# Patient Record
Sex: Female | Born: 1970 | Hispanic: No | Marital: Married | Smoking: Never smoker
Health system: Southern US, Community
[De-identification: ages and names within clinical notes are randomized; demographics above are authoritative.]

---

## 2003-11-10 ENCOUNTER — Inpatient Hospital Stay (HOSPITAL_COMMUNITY): Admission: AD | Admit: 2003-11-10 | Discharge: 2003-11-11 | Payer: Self-pay | Admitting: Obstetrics and Gynecology

## 2003-11-11 ENCOUNTER — Inpatient Hospital Stay (HOSPITAL_COMMUNITY): Admission: AD | Admit: 2003-11-11 | Discharge: 2003-11-15 | Payer: Self-pay | Admitting: Obstetrics and Gynecology

## 2004-03-08 ENCOUNTER — Other Ambulatory Visit: Admission: RE | Admit: 2004-03-08 | Discharge: 2004-03-08 | Payer: Self-pay | Admitting: Obstetrics and Gynecology

## 2005-11-14 ENCOUNTER — Other Ambulatory Visit: Admission: RE | Admit: 2005-11-14 | Discharge: 2005-11-14 | Payer: Self-pay | Admitting: Obstetrics and Gynecology

## 2007-10-15 ENCOUNTER — Encounter: Admission: RE | Admit: 2007-10-15 | Discharge: 2007-10-15 | Payer: Self-pay | Admitting: Obstetrics and Gynecology

## 2007-10-18 ENCOUNTER — Inpatient Hospital Stay (HOSPITAL_COMMUNITY): Admission: RE | Admit: 2007-10-18 | Discharge: 2007-10-21 | Payer: Self-pay | Admitting: Obstetrics and Gynecology

## 2007-10-23 ENCOUNTER — Encounter: Admission: RE | Admit: 2007-10-23 | Discharge: 2007-11-12 | Payer: Self-pay | Admitting: Obstetrics and Gynecology

## 2010-08-23 NOTE — Op Note (Signed)
Katherine [name], Katherine [name]               ACCOUNT NO.:  [id number]   MEDICAL RECORD NO.:  [id number]          PATIENT TYPE:  INP   LOCATION:  9146                          FACILITY:  [facility]   PHYSICIAN:  Crist Fat. Rivard, M.D. DATE OF BIRTH:  12/12/1970   DATE OF PROCEDURE:  10/18/2007  DATE OF DISCHARGE:                               OPERATIVE REPORT   PREOPERATIVE DIAGNOSIS:  Intrauterine pregnancy at 37 weeks and 6 days  with previous cesarean sections with rupture of membranes and early  labor.   POSTOPERATIVE DIAGNOSIS:  Intrauterine pregnancy at 37 weeks and 6 days  with previous cesarean sections with rupture of membranes and early  labor.   ANESTHESIA:  Spinal, Burnett Corrente, MD.   PROCEDURE:  Repeat low-transverse cesarean section.   SURGEON:  Crist Fat. Rivard, MD   ASSISTANT:  Cam Hai, CNM   ESTIMATED BLOOD LOSS:  600 mL.   PROCEDURE:  After being informed of the planned procedure with possible  complications including bleeding, infection, and injury to bowels,  bladder, or ureters, informed consent was obtained.  The patient was  taken to OR #1, given spinal anesthesia without any complication.  She  was placed in the dorsal decubitus position, pelvis was tilted to the  left, prepped and draped in a sterile fashion and a Foley catheter was  inserted in her bladder.   After assessing adequate level of anesthesia, we infiltrated the  previous Pfannenstiel incision using 20 mL of Marcaine 0.25% and we  performed a Pfannenstiel incision, which was brought down sharply to the  fascia.  Fascia was incised in a low-transverse fashion.  Linea alba was  dissected.  Peritoneum was entered bluntly.  An Alexis retractor was  inserted easily.  Visceral peritoneum was entered in a low-transverse  fashion allowing [place] to safely retract bladder by developing a bladder  flap.  Myometrium was then entered in a low-transverse fashion, first  with knife, then extended bluntly.   Amniotic fluid was collected that  appeared normal.  We assisted the birth of a female infant at 6:37 a.m.  in right occiput anterior.  Mouth and nose were suctioned.  Nuchal cord  was reduced.  Baby was delivered.  Cord was clamped with 2 Kelly clamps  and sectioned, and the baby was given to the pediatrician, Dr. Christella Scheuermann  present in the room.  A 10 mL of blood was drawn from the umbilical vein  and we awaited the spontaneous expulsion of the placenta.  This placenta  was complete and the cord had 3 vessels.  It was sent to Labor and  Delivery.  Uterine revision was negative.   Myometrium was then closed in 2 layers, first with a running lock suture  of 0 Vicryl, then with a Lembert suture of 0 Vicryl imbricating the  first one.  Hemostasis is completed on peritoneal edges with  cauterization.  Both paracolic gutters were cleaned.  Both tubes and  ovaries were assessed and normal.  We irrigated the pelvis profusely  with warm saline and we noted a satisfactory hemostasis.  Retractors  were removed  and under fascia hemostasis was completed with cautery and  the fascia was closed with 2 running sutures of 1 Vicryl meeting  midline.  The wound was irrigated with warm saline and the skin was  closed with a subcuticular suture of 3-0 Monocryl and Steri-Strips.   Instruments and sponge count was complete x2.  Estimated blood loss was  600 mL.  The procedure was very well tolerated by the patient who was  taken to the recovery room in a well and stable condition.   Little girl named Serena Colonel, was born at 6:37 a.m., received an  Apgar of 8 at 1 minute and 9 at 5 minutes and weighed 7 pounds 4 ounces.   SPECIMEN:  Placenta sent to Labor and Delivery.      Crist Fat Rivard, M.D.  Electronically Signed     SAR/MEDQ  D:  10/18/2007  T:  10/19/2007  Job:  [id number]

## 2010-08-23 NOTE — H&P (Signed)
NAME:  Katherine [name], Katherine [name]               ACCOUNT NO.:  [id number]   MEDICAL RECORD NO.:  [id number]          PATIENT TYPE:  MAT   LOCATION:  DFTL                          [facility]:  [facility]   PHYSICIAN:  Crist Fat. Rivard, M.D. DATE OF BIRTH:  [birth year]   DATE OF ADMISSION:  10/18/2007  DATE OF DISCHARGE:                              HISTORY & PHYSICAL   Katherine [name] is a 40 year old married white female gravida 2, para 1-0-0-  1, at 37-6/7th weeks, who presents with leaking clear fluid since  approximately 2 a.m. with onset of cramping.  At that time, she denies  bleeding and reports positive fetal movement.  Her pregnancy has been  followed by the [facility] that has been  remarkable for:  1. Previous C-section, desires repeat, which has been scheduled for      October 28, 2007.  2. Depression and anxiety.  3. Unrepaired periumbilical hernia.  4. Group B strep negative.   LABORATORY DATA:  Her prenatal labs were collected on May 03, 2007.  Hemoglobin 10.1, hematocrit 29.2, and platelets 254,000.  Blood type O  positive, antibody negative, RPR nonreactive, rubella immune, hepatitis  B surface antigen negative, HIV nonreactive, gonorrhea negative,  Chlamydia negative, and Pap smear within normal limits from August 2008.  Cystic fibrosis negative.  First-trimester screen from April 23, 2007  was within normal limits.  A 1-hour Glucola from August 06, 2007 was 114.  Culture of the vaginal tract for group B strep in the third trimester  was negative.   HISTORY OF PRESENT PREGNANCY:  The patient presented for care at [facility] on March 19, 2007 at 37-5/7th weeks' gestation.  Ultrasound  on that date confirmed best [facility] to be November 02, 2007.  The patient was  taking Effexor, and a taper schedule to wean her off that medication was  started within a couple of weeks of her first visit.  She had normal  first-trimester screen at 12 weeks' gestation.  She had some  spotting at  14 weeks' gestation.  By 15 weeks, she had tapered off Effexor and was  only on Zoloft and was doing well.  She had anatomy scan at 18 weeks'  gestation that showed growth consistent with previous dating.  She has a  normal 1-hour Glucola.  She had some hernia tenderness at 27 weeks that  resolved.  Rest of her prenatal care has been unremarkable.   OB HISTORY:  She is a gravida 2, para 1-0-0-1.  In August 2005, she had  a C-section for a female infant weighing 8 pounds 3 ounces at 40 weeks'  gestation.  After 28 hours of labor, she had an epidural for anesthesia.  C-section was done for failure to descend, and the infant's name was  Huntley Dec.  She had a postpartum depression after that baby.  Second  pregnancy is the current pregnancy.   PAST MEDICAL HISTORY:  She has no medication allergies.  She experienced  menarche at the age of 54 with 24-day cycles lasting 5 days.  She has  questionable history of chicken  pox.  She has had one episode of  cystitis.  History of depression and anxiety since 2003, followed by Dr.  Uvaldo Rising.  She fractured her left arm at the age of 68, and fractured her  right foot at the age of 66.   PAST SURGICAL HISTORY:  Remarkable for a C-section in 2005, laser eye  surgery in 2001 and 2002, and wisdom teeth extraction.   FAMILY MEDICAL HISTORY:  Father and maternal grandmother with  hypertension.  Multiple family members with varicosities.  Paternal  grandfather with asthma.  Paternal grandmother with diabetes.  Mother  and paternal grandmother with thyroid dysfunction.  Maternal grandmother  with dialysis.  Paternal grandmother with CVA.  Maternal grandfather  with larynx cancer.   GENETIC HISTORY:  Remarkable for father of the baby's brother with  clotting disorder.   SOCIAL HISTORY:  The patient is married, lives with father of the baby.  His name is Homero Fellers.  He is involved and supportive.  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  They denied  any alcohol, tobacco, or illicit drug use with the pregnancy.   OBJECTIVE DATA:  VITAL SIGNS:  Stable.  She is afebrile.  HEENT:  Grossly within normal limits.  CHEST:  Clear to auscultation.  HEART:  Regular rate and rhythm.  ABDOMEN:  Gravid.  Contour with fundal height extending approximately 37  cm above pubic symphysis.  Fetal heart rate is reactive and reassuring.  There are irregular mild contractions.  RECTAL:  Sterile speculum exam shows positive pooling, positive  Nitrazine, and positive ferning of clear fluid.  Cervix is 1 cm, 60%  effaced.  Vertex -2, which is unchanged from the last exam.  EXTREMITIES:  Within normal limits.   ASSESSMENT:  1. Intrauterine pregnancy at term.  2. Spontaenous rupture of membranes.  3. Scheduled cesarean section.   PLAN:  Prepare the patient for the operating room, and Dr. Estanislado Pandy will  perform cesarean section.      Cam Hai, C.N.M.      Crist Fat Rivard, M.D.  Electronically Signed    KS/MEDQ  D:  10/18/2007  T:  10/19/2007  Job:  [id number]

## 2010-08-23 NOTE — Discharge Summary (Signed)
Katherine [name], [name]               ACCOUNT NO.:  [id number]   MEDICAL RECORD NO.:  [id number]          PATIENT TYPE:  INP   LOCATION:  9146                          [facility]   PHYSICIAN:  Crist Fat. Rivard, M.D. DATE OF BIRTH:  1970/08/02   DATE OF ADMISSION:  10/18/2007  DATE OF DISCHARGE:  10/21/2007                               DISCHARGE SUMMARY   ADMITTING DIAGNOSES:  1. Intrauterine pregnancy at 37-6/7 weeks.  2. Previous cesarean section with desire for repeat.  3. Rupture of membranes.   DISCHARGE DIAGNOSES:  1. Intrauterine pregnancy at 37-6/7 weeks.  2. Previous cesarean section with desire for repeat.  3. Rupture of membranes.   PROCEDURES:  Repeat low-transverse cesarean section.   HOSPITAL COURSE:  Katherine [name] is a 40 year old gravida 2, para 1-0-0-1  at 37-6/7 weeks who presents with leaking clear fluid since early in the  morning of October 18, 2007.  She did have some cramping.  Her pregnancy  had been remarkable for:  1. Previous cesarean section with desire for repeat.  2. Depression and anxiety with the patient on Zoloft on her pregnancy.  3. Unrepaired periumbilical hernia.  4. Group B strep, unknown.   Admission fetal heart rate is reactive.  There were regular contractions  noted, verification of ruptured membranes was accomplished and the  cervix was 160% vertex at -2.  In light of the patient's plan for a  repeat cesarean section on 07:20, she was prepped for OR.  The patient  was taken to the operating room where a repeat low-transverse cesarean  section was performed by Dr. Estanislado Pandy under spinal anesthesia.  Findings  were a viable female by the name of Katherine [name], born at 06:37 a.m.  Apgars  were 8 and 8.  Weight was 7 pounds 4 ounces.  The patient tolerated the  procedure well and was taken to recovery in good condition.  Infant was  taken to the full-term nursery.  By postop day #1, the patient is doing  well.  She was having some incisional pain.  She  was initially bottle  feeding then went to breast feeding, then decided to do a little bit of  both.  Her hemoglobin on day #1 was 10.4 down from 12.1, white blood  cell count was 9.5, and platelet count was 159.  Her incision was clean,  dry, and intact; closed with subcuticular sutures and Steri-Strips.   Her physical exam was within normal limits.  She was tolerating pain  medication and a regular diet without difficulty.  Rest of the patient's  hospital stay was uncomplicated.  She did have a social work consult  secondary to her history of postpartum depression and general  depression/anxiety.  The patient was remained on Zoloft 150 mg p.o.  daily.  She seemed to have good support and good awareness of the signs  and symptoms of postpartum depression and was having no issues at the  time of her discharge.  By the postop day #3, the patient is doing well.  Incision was clean, dry, and intact.  She was not decided regarding  birth control.  She was doing a little bit of both breast and bottle  feeding.  She was deemed to receive full benefit of her hospital stay  and was discharged home.   DISCHARGE INSTRUCTIONS:  Per [facility] handout.   DISCHARGE MEDICATIONS:  1. Motrin 600 mg p.o. q.6 h. p.r.n. pain.  2. Percocet 5/325 one to two p.o. q.24 h. p.r.n. pain.  3. Zoloft 150 mg p.o. daily.   FOLLOWUP:  Discharge followup will occur in 6 weeks at [facility] or p.r.n.  Signs and symptoms of postpartum depression were reviewed  again with the patient, and she will notify us with any issues.      Renaldo Reel Emilee Hero, C.N.M.      Crist Fat Rivard, M.D.  Electronically Signed    VLL/MEDQ  D:  10/21/2007  T:  10/21/2007  Job:  [id number]

## 2011-01-05 LAB — CBC
HCT: 31.1 — ABNORMAL LOW
MCHC: 33.8
MCV: 85.7
Platelets: 159
Platelets: 186
RBC: 4.18
RDW: 12.9
RDW: 13.1

## 2011-01-05 LAB — RPR: RPR Ser Ql: NONREACTIVE

## 2011-06-08 ENCOUNTER — Ambulatory Visit (INDEPENDENT_AMBULATORY_CARE_PROVIDER_SITE_OTHER): Payer: BC Managed Care – PPO | Admitting: Obstetrics and Gynecology

## 2011-06-08 DIAGNOSIS — Z01419 Encounter for gynecological examination (general) (routine) without abnormal findings: Secondary | ICD-10-CM

## 2016-03-20 ENCOUNTER — Ambulatory Visit (INDEPENDENT_AMBULATORY_CARE_PROVIDER_SITE_OTHER): Payer: Self-pay | Admitting: Orthopedic Surgery

## 2016-03-22 ENCOUNTER — Ambulatory Visit (INDEPENDENT_AMBULATORY_CARE_PROVIDER_SITE_OTHER): Payer: Self-pay

## 2016-03-22 ENCOUNTER — Ambulatory Visit (INDEPENDENT_AMBULATORY_CARE_PROVIDER_SITE_OTHER): Payer: BC Managed Care – PPO | Admitting: Orthopedic Surgery

## 2016-03-22 ENCOUNTER — Encounter (INDEPENDENT_AMBULATORY_CARE_PROVIDER_SITE_OTHER): Payer: Self-pay | Admitting: Orthopedic Surgery

## 2016-03-22 ENCOUNTER — Encounter (INDEPENDENT_AMBULATORY_CARE_PROVIDER_SITE_OTHER): Payer: Self-pay

## 2016-03-22 DIAGNOSIS — R202 Paresthesia of skin: Secondary | ICD-10-CM | POA: Diagnosis not present

## 2016-03-22 DIAGNOSIS — M542 Cervicalgia: Secondary | ICD-10-CM | POA: Diagnosis not present

## 2016-03-22 DIAGNOSIS — R200 Anesthesia of skin: Secondary | ICD-10-CM | POA: Insufficient documentation

## 2016-03-22 DIAGNOSIS — M25512 Pain in left shoulder: Secondary | ICD-10-CM | POA: Diagnosis not present

## 2016-03-22 MED ORDER — METHOCARBAMOL 500 MG PO TABS: 500.0000 mg | ORAL_TABLET | Freq: Three times a day (TID) | ORAL | 0 refills | Status: DC | PRN

## 2016-03-22 MED ORDER — PREDNISONE 5 MG (21) PO TBPK: 5.0000 mg | ORAL_TABLET | Freq: Every day | ORAL | 0 refills | Status: DC

## 2016-03-24 NOTE — Progress Notes (Signed)
Office Visit Note   Patient: Katherine FischerMaria Mercedes [name]           Date of Birth: 01/19/1971           MRN: [id number] Visit Date: 03/22/2016 Requested by: Gweneth DimitriWendy McNeill, MD [address] [place], [place] [address] PCP: Gweneth DimitriMCNEILL,WENDY, MD  Subjective: Chief Complaint  Patient presents with  . Neck - Pain    HPI Katherine [name] is a 45 year old patient with left shoulder and neck pain.  Started the day before Thanksgiving where she had the onset of left greater than right pain in the upper back and neck.  She reported a little bit of discomfort prior to this for 2 weeks.  Describes stiffness and significant pain radiating anteriorly and posteriorly into the deep aspect of the left shoulder.  Describes left upper extremity pain with radiation down into the index finger with tingling.  Took Naprosyn and Flexeril which accelerated her depression.  It did ease the pain but that she still has significant symptoms.  States that she reports bilateral arm pain but it is worse on the left.  She's had no medications and Saturday.  Her symptoms radiate down to the left hand.  Describes a cramping pain in the shoulder.  This pain does wake her up sleep at night.  She is teaching as as a profession.  She's not a smoker.  Describes numbness and tingling in the fingertips only.              Review of Systems All systems reviewed are negative as they relate to the chief complaint within the history of present illness.  Patient denies  fevers or chills.    Assessment & Plan: Visit Diagnoses:  1. Cervicalgia   2. Numbness and tingling in left upper extremity   3. Acute pain of left shoulder     Plan: Impression is left sided radiculopathy with normal shoulder exam and significant the trapezial tenderness and radiation of pain and numbness down to the hand.  Plan is MRI of her cervical spine.  Also we discussed Medrol Dosepak which I think could be helpful.  I would not do any more narcotic pain medicine because of her  other mood adjustment drugs follow-up after the study  Follow-Up Instructions: Return for after MRI.   Orders:  Orders Placed This Encounter  Procedures  . XR Cervical Spine 2 or 3 views  . XR Shoulder Left  . MR Cervical Spine w/o contrast   Meds ordered this encounter  Medications  . predniSONE (STERAPRED UNI-PAK 21 TAB) 5 MG (21) TBPK tablet    Sig: Take 1 tablet (5 mg total) by mouth daily.    Dispense:  21 tablet    Refill:  0  . methocarbamol (ROBAXIN) 500 MG tablet    Sig: Take 1 tablet (500 mg total) by mouth every 8 (eight) hours as needed for muscle spasms.    Dispense:  30 tablet    Refill:  0      Procedures: No procedures performed   Clinical Data: No additional findings.  Objective: Vital Signs: There were no vitals taken for this visit.  Physical Exam   Constitutional: Patient appears well-developed HEENT:  Head: Normocephalic Eyes:EOM are normal Neck: Normal range of motion Cardiovascular: Normal rate Pulmonary/chest: Effort normal Neurologic: Patient is alert Skin: Skin is warm Psychiatric: Patient has normal mood and affect    Ortho Exam patient has full cervical spine range of motion flexion extension rotation.  She has  5 out of 5 grip EPL FPL interosseous wrist flexion-extension biceps triceps Dr. Loletha Carrowstrength.  Does have paresthesias C6 distribution left compared to right reflexes symmetric bilateral biceps triceps 1+ out of 4.  Radial pulses intact bilaterally.  Shoulder range of motion is full rotator cuff strength symmetric no impingement signs on the left negative apprehension relocation testing on the left.  Not much in the way of course grinding or crepitus in the left shoulder on this visit.  Specialty Comments:  No specialty comments available.  Imaging: No results found.   PMFS History: Patient Active Problem List   Diagnosis Date Noted  . Cervicalgia 03/22/2016  . Numbness and tingling in left upper extremity 03/22/2016   No  past medical history on file.  No family history on file.  No past surgical history on file. Social History   Occupational History  . Not on file.   Social History Main Topics  . Smoking status: Never Smoker  . Smokeless tobacco: Never Used  . Alcohol use Not on file  . Drug use: Unknown  . Sexual activity: Not on file

## 2016-04-01 ENCOUNTER — Other Ambulatory Visit: Payer: Self-pay

## 2016-04-05 ENCOUNTER — Ambulatory Visit
Admission: RE | Admit: 2016-04-05 | Discharge: 2016-04-05 | Disposition: A | Discharge status: Home / Self Care | Payer: BC Managed Care – PPO | Source: Ambulatory Visit | Attending: Orthopedic Surgery | Admitting: Orthopedic Surgery

## 2016-04-05 DIAGNOSIS — M542 Cervicalgia: Secondary | ICD-10-CM

## 2016-04-15 ENCOUNTER — Other Ambulatory Visit: Payer: Self-pay

## 2016-04-20 ENCOUNTER — Ambulatory Visit (INDEPENDENT_AMBULATORY_CARE_PROVIDER_SITE_OTHER): Payer: BC Managed Care – PPO | Admitting: Orthopedic Surgery

## 2019-12-03 ENCOUNTER — Other Ambulatory Visit: Payer: Self-pay | Admitting: Obstetrics and Gynecology

## 2019-12-03 DIAGNOSIS — R1900 Intra-abdominal and pelvic swelling, mass and lump, unspecified site: Secondary | ICD-10-CM

## 2019-12-16 ENCOUNTER — Other Ambulatory Visit: Payer: Self-pay | Admitting: Obstetrics and Gynecology

## 2019-12-18 ENCOUNTER — Ambulatory Visit
Admission: RE | Admit: 2019-12-18 | Discharge: 2019-12-18 | Disposition: A | Discharge status: Home / Self Care | Payer: BC Managed Care – PPO | Source: Ambulatory Visit | Attending: Obstetrics and Gynecology | Admitting: Obstetrics and Gynecology

## 2019-12-18 ENCOUNTER — Other Ambulatory Visit: Payer: Self-pay

## 2019-12-18 DIAGNOSIS — R1900 Intra-abdominal and pelvic swelling, mass and lump, unspecified site: Secondary | ICD-10-CM

## 2019-12-18 MED ORDER — IOPAMIDOL (ISOVUE-300) INJECTION 61%
100.0000 mL | Freq: Once | INTRAVENOUS | Status: AC | PRN
  Administered 2019-12-18: 100 mL via INTRAVENOUS

## 2019-12-24 ENCOUNTER — Ambulatory Visit: Payer: BC Managed Care – PPO | Admitting: Family Medicine

## 2019-12-24 ENCOUNTER — Other Ambulatory Visit: Payer: Self-pay

## 2019-12-24 VITALS — BP 110/78 | HR 88 | Ht 60.0 in | Wt 126.8 lb

## 2019-12-24 DIAGNOSIS — M419 Scoliosis, unspecified: Secondary | ICD-10-CM | POA: Insufficient documentation

## 2019-12-24 DIAGNOSIS — M542 Cervicalgia: Secondary | ICD-10-CM

## 2019-12-24 DIAGNOSIS — G5701 Lesion of sciatic nerve, right lower limb: Secondary | ICD-10-CM

## 2019-12-24 DIAGNOSIS — K429 Umbilical hernia without obstruction or gangrene: Secondary | ICD-10-CM | POA: Insufficient documentation

## 2019-12-24 DIAGNOSIS — F419 Anxiety disorder, unspecified: Secondary | ICD-10-CM | POA: Insufficient documentation

## 2019-12-24 DIAGNOSIS — M4126 Other idiopathic scoliosis, lumbar region: Secondary | ICD-10-CM | POA: Diagnosis not present

## 2019-12-24 DIAGNOSIS — M5441 Lumbago with sciatica, right side: Secondary | ICD-10-CM | POA: Diagnosis not present

## 2019-12-24 DIAGNOSIS — G4700 Insomnia, unspecified: Secondary | ICD-10-CM | POA: Insufficient documentation

## 2019-12-24 DIAGNOSIS — F32A Depression, unspecified: Secondary | ICD-10-CM | POA: Insufficient documentation

## 2019-12-24 MED ORDER — METHOCARBAMOL 500 MG PO TABS: 500.0000 mg | ORAL_TABLET | Freq: Three times a day (TID) | ORAL | 1 refills | Status: AC | PRN

## 2019-12-24 NOTE — Progress Notes (Signed)
    Subjective:    CC: Low back pain  HPI: Pt is a 49 y/o female presenting w/ R-sided low back / buttocks pain x 2 weeks w/ no known MOI.  She states that she has scoliosis and should be using a small heel lift in her L shoe to correct for this.  Main issue today is right low back pain and buttocks pain.  She notes occasional intermittent pain radiating down the posterior aspect of her right leg.  However the majority of her pain is in the buttocks and right low back.  She also has a chronic issue with thoracic back pain and cervical pain with a history of left-sided cervical radiculopathy in 2017 that now is mostly better.  Radiating pain: yes into her R LE LE numbness/tingling: No LE weakness: No Aggravating factors: walking Treatments tried: heat, stretching, Aleve, Methocarbamol  Pertinent review of Systems: No fevers or chills  Relevant historical information: Mild lumbar scoliosis.  Insomnia anxiety depression.   Objective:    Vitals:   12/24/19 1241  BP: 110/78  Pulse: 88  SpO2: 99%   General: Well Developed, well nourished, and in no acute distress.   MSK: L-spine normal-appearing Nontender midline. Mildly tender right lumbar paraspinal musculature. Normal lumbar motion. Extremity strength reflexes and sensation are intact. Negative slump test.  Right hip normal-appearing normal motion.  Tender palpation posterior aspect of greater trochanter. Hip abduction strength is excellent as is external rotation strength.  Lab and Radiology Results X-ray images obtained during CT scan abdomen and pelvis December 18, 2019 personally and independently reviewed. Mild lumbar scoliosis.  Minimal L5-S1 DDD   Impression and Recommendations:    Assessment and Plan: 49 y.o. female with right low back pain and right lateral posterior hip pain.  This occurs in the setting of mild scoliosis.  Pain mostly due to lumbosacral muscle spasm and dysfunction.  May have component  of piriformis syndrome as well.  Plan for trial of physical therapy.  Intermittent use of methocarbamol heating pad and TENS unit as well.  We will ask physical therapy to also work on thoracic and cervical spine pain as this is a somewhat chronic issue..   Orders Placed This Encounter  Procedures  . Ambulatory referral to Physical Therapy    Referral Priority:   Routine    Referral Type:   Physical Medicine    Referral Reason:   Specialty Services Required    Requested Specialty:   Physical Therapy   Meds ordered this encounter  Medications  . methocarbamol (ROBAXIN) 500 MG tablet    Sig: Take 1 tablet (500 mg total) by mouth every 8 (eight) hours as needed for muscle spasms.    Dispense:  30 tablet    Refill:  1    Discussed warning signs or symptoms. Please see discharge instructions. Patient expresses understanding.   The above documentation has been reviewed and is accurate and complete Clementeen Graham, M.D.

## 2019-12-24 NOTE — Patient Instructions (Signed)
Thank you for coming in today. Plan for PT.  Please let me know if you have a problem.  Use heat and TENS unit.   Recheck in 6 weeks or sooner if needed.    Ok to continue [employer].   TENS UNIT: This is helpful for muscle pain and spasm.   Search and Purchase a TENS 7000 2nd edition at  www.tenspros.com or www.Amazon.com It should be less than $30.     TENS unit instructions: Do not shower or bathe with the unit on . Turn the unit off before removing electrodes or batteries . If the electrodes lose stickiness add a drop of water to the electrodes after they are disconnected from the unit and place on plastic sheet. If you continued to have difficulty, call the TENS unit company to purchase more electrodes. . Do not apply lotion on the skin area prior to use. Make sure the skin is clean and dry as this will help prolong the life of the electrodes. . After use, always check skin for unusual red areas, rash or other skin difficulties. If there are any skin problems, does not apply electrodes to the same area. . Never remove the electrodes from the unit by pulling the wires. . Do not use the TENS unit or electrodes other than as directed. . Do not change electrode placement without consultating your therapist or physician. Marland Kitchen Keep 2 fingers with between each electrode. . Wear time ratio is 2:1, on to off times.    For example on for 30 minutes off for 15 minutes and then on for 30 minutes off for 15 minutes

## 2020-01-13 ENCOUNTER — Ambulatory Visit (INDEPENDENT_AMBULATORY_CARE_PROVIDER_SITE_OTHER): Payer: BC Managed Care – PPO | Admitting: Physical Therapy

## 2020-01-13 ENCOUNTER — Encounter: Payer: Self-pay | Admitting: Physical Therapy

## 2020-01-13 ENCOUNTER — Other Ambulatory Visit: Payer: Self-pay

## 2020-01-13 DIAGNOSIS — M545 Low back pain, unspecified: Secondary | ICD-10-CM | POA: Diagnosis not present

## 2020-01-13 DIAGNOSIS — M542 Cervicalgia: Secondary | ICD-10-CM

## 2020-01-13 DIAGNOSIS — M25551 Pain in right hip: Secondary | ICD-10-CM

## 2020-01-20 ENCOUNTER — Encounter: Payer: BC Managed Care – PPO | Admitting: Physical Therapy

## 2020-01-22 ENCOUNTER — Ambulatory Visit: Payer: BC Managed Care – PPO | Admitting: Physical Therapy

## 2020-01-22 ENCOUNTER — Other Ambulatory Visit: Payer: Self-pay

## 2020-01-22 DIAGNOSIS — M545 Low back pain, unspecified: Secondary | ICD-10-CM | POA: Diagnosis not present

## 2020-01-22 DIAGNOSIS — M25551 Pain in right hip: Secondary | ICD-10-CM | POA: Diagnosis not present

## 2020-01-22 DIAGNOSIS — M542 Cervicalgia: Secondary | ICD-10-CM | POA: Diagnosis not present

## 2020-01-24 ENCOUNTER — Encounter: Payer: Self-pay | Admitting: Physical Therapy

## 2020-01-24 NOTE — Patient Instructions (Signed)
Access Code: [email or web address] URL: [email or web address] Date: 01/24/2020 Prepared by: Sedalia Muta  Exercises Seated Cervical Sidebending Stretch - 2 x daily - 3 reps - 30 hold Seated Levator Scapulae Stretch - 2 x daily - 3 reps - 30 hold Standing Row with Anchored Resistance - 1 x daily - 2 sets - 10 reps Standing Shoulder Horizontal Abduction with Resistance - 1 x daily - 2 sets - 10 reps Tricep Push Up on Wall - 1 x daily - 2 sets - 10 reps Supine Figure 4 Piriformis Stretch - 2 x daily - 3 reps - 30 hold Seated Piriformis Stretch with Trunk Bend - 2 x daily - 3 reps - 30 hold Child's Pose with Sidebending - 2 x daily - 3 reps - 30 hold Supine Single Knee to Chest Stretch - 2 x daily - 3 reps - 30 hold Sidelying Hip Abduction - 1 x daily - 2 sets - 10 reps Clamshell - 1 x daily - 2 sets - 10 reps Supine Bridge - 1 x daily - 2 sets - 10 reps

## 2020-01-24 NOTE — Therapy (Signed)
[facility] [address] [place], [place], [address] Phone: [phone]   Fax:  [phone]  Physical Therapy Evaluation  Patient Details  Name: Katherine [name] MRN: [id number] Date of Birth: May 29, 1970 Referring Provider (PT): Clementeen Graham   Encounter Date: 01/13/2020   PT End of Session - 01/24/20 1529    Visit Number 1    Number of Visits 12    Date for PT Re-Evaluation 02/24/20    Authorization Type BCBS    PT Start Time 0805    PT Stop Time 0839    PT Time Calculation (min) 34 min    Activity Tolerance Patient tolerated treatment well    Behavior During Therapy [facility] for tasks assessed/performed           History reviewed. No pertinent past medical history.  History reviewed. No pertinent surgical history.  There were no vitals filed for this visit.    Subjective Assessment - 01/24/20 1527    Subjective Pt notes increased pain in R glute, no incident to report. She does like to run 3-4 mi. Increased pain with increased standing/walking. Also reports chroninc pain in neck/ previous disc issue. States tightness and tension in bil UTs and shoulder blades.    Patient Stated Goals decreased pain    Currently in Pain? Yes    Pain Score 4     Pain Location Hip    Pain Orientation Right    Pain Descriptors / Indicators Aching    Pain Type Acute pain    Pain Onset 1 to 4 weeks ago    Pain Frequency Intermittent    Aggravating Factors  increased standing/walking    Pain Score 3    Pain Location Neck    Pain Orientation Right;Left    Pain Descriptors / Indicators Tightness    Pain Type Chronic pain    Pain Onset More than a month ago    Pain Frequency Intermittent    Aggravating Factors  increased activity, UE movement              [facility] PT Assessment - 01/24/20 0001      Assessment   Medical Diagnosis R low back pain, hip pain, and chronic neck pain    Referring Provider (PT) Clementeen Graham    Prior Therapy no        Prior Function   Level of Independence Independent      Cognition   Overall Cognitive Status Within Functional Limits for tasks assessed      ROM / Strength   AROM / PROM / Strength AROM;Strength      AROM   Overall AROM Comments lumbar: mild limitation for flex, ext, and L SB;  Cervical: mild limitations for ext and SB.       Strength   Overall Strength Comments shoulder: 4/5 gross, scapular: 4-/5,  Hips: 4-/5 to 4/5       Palpation   Palpation comment tenderness in R glute, piriformis, glute med, Tightness and tenderness in bil UT, rhomboids.                       Objective measurements completed on examination: See above findings.       [facility] Adult PT Treatment/Exercise - 01/24/20 0001      Lumbar Exercises: Stretches   Piriformis Stretch 3 reps;30 seconds    Piriformis Stretch Limitations seated and supine      Manual Therapy   Manual Therapy Joint  mobilization;Soft tissue mobilization    Manual therapy comments skilled palpation and  monitoring of soft tissue with dry needling    Soft tissue mobilization STM/DTM to R glute .             Trigger Point Dry Needling - 01/24/20 0001    Consent Given? Yes    Education Handout Provided Yes    Muscles Treated Back/Hip Gluteus minimus;Gluteus maximus;Piriformis    Gluteus Minimus Response Palpable increased muscle length   R   Gluteus Maximus Response Palpable increased muscle length   R   Piriformis Response Palpable increased muscle length   R               PT Education - 01/24/20 1528    Education Details PT POC, exam findings, educationon dry needling, HEP    Person(s) Educated Patient    Methods Explanation;Demonstration;Verbal cues    Comprehension Verbalized understanding;Returned demonstration;Verbal cues required            PT Short Term Goals - 01/24/20 1531      PT SHORT TERM GOAL #1   Title Pt to be independent with initial HEP    Time 2    Period Weeks    Status New     Target Date 01/27/20             PT Long Term Goals - 01/24/20 1532      PT LONG TERM GOAL #1   Title Pt to be independent with final HEP    Time 6    Period Weeks    Status New    Target Date 02/24/20      PT LONG TERM GOAL #2   Title Pt to report decreased pain in R low back and glute to be 0-2/10 with activity    Time 6    Period Weeks    Status New    Target Date 02/24/20      PT LONG TERM GOAL #3   Title Pt to demo soft tissue limitations in UTs and glute to be WNL and pain free.    Time 6    Status New    Target Date 02/24/20      PT LONG TERM GOAL #4   Title Pt to be independent with HEP for neck and shoulder tightness and pain    Time 6    Period Weeks    Status New    Target Date 02/24/20      PT LONG TERM GOAL #5   Title Pt to demo improved strength of bil hips to at least 4+/5 to improve stability and pain    Time 6    Period Weeks    Status New    Target Date 02/24/20                  Plan - 01/24/20 1541    Clinical Impression Statement Pt presents with primary complaints of increased pain in low back, into R glute, as well as chronic tightness, spasm and pain in bil UT and thoracic region. Pt with muscle spasm in glute and bil UT and rhomboids today. Pt with mild postural dysfunction and mild scoliosis. Pt with decreased strength in hips and core, and has lack of effective HEP for Dx. Pt to benefit from skilled PT to improve.    Examination-Activity Limitations Locomotion Level;Reach Overhead;Sleep;Squat;Stairs;Stand;Lift    Examination-Participation Restrictions Cleaning;Community Activity;Shop;Yard Work    Conservation officer, historic buildings Stable/Uncomplicated    Clinical  Decision Making Low    Rehab Potential Good    PT Frequency 2x / week    PT Duration 6 weeks    PT Treatment/Interventions ADLs/Self Care Home Management;Cryotherapy;Electrical Stimulation;DME Instruction;Ultrasound;Traction;Moist Heat;Iontophoresis 4mg /ml  Dexamethasone;Gait training;Stair training;Functional mobility training;Therapeutic exercise;Balance training;Therapeutic activities;Orthotic Fit/Training;Patient/family education;Neuromuscular re-education;Manual techniques;Passive range of motion;Dry needling;Spinal Manipulations;Joint Manipulations;Taping    Consulted and Agree with Plan of Care Patient           Patient will benefit from skilled therapeutic intervention in order to improve the following deficits and impairments:  Increased muscle spasms, Decreased activity tolerance, Pain, Impaired flexibility, Improper body mechanics, Decreased strength, Decreased mobility  Visit Diagnosis: Acute right-sided low back pain without sciatica  Pain in right hip  Cervicalgia     Problem List Patient Active Problem List   Diagnosis Date Noted  . Anxiety 12/24/2019  . Chronic depression 12/24/2019  . Insomnia 12/24/2019  . Umbilical hernia 12/24/2019  . Lumbar scoliosis 12/24/2019  . Cervicalgia 03/22/2016    03/24/2016, PT, DPT 3:51 PM  01/24/20    [facility] [address] [place], [place], [place] Phone: [phone]   Fax:  [phone]  Name: Katherine [name] MRN: Billy [id number] Date of Birth: 1971/02/19

## 2020-01-24 NOTE — Therapy (Addendum)
[facility] [address] [place], [place], [address] Phone: [phone]   Fax:  [phone]  Physical Therapy Treatment  Patient Details  Name: Katherine [name] MRN: [id number] Date of Birth: 07/19/1970 Referring Provider (PT): Lynne Leader   Encounter Date: 01/22/2020   PT End of Session - 01/24/20 1557    Visit Number 2    Number of Visits 12    Date for PT Re-Evaluation 02/24/20    Authorization Type BCBS    PT Start Time [phone]    PT Stop Time 0930    PT Time Calculation (min) 43 min    Activity Tolerance Patient tolerated treatment well    Behavior During Therapy [facility] for tasks assessed/performed           No past medical history on file.  No past surgical history on file.  There were no vitals filed for this visit.   Subjective Assessment - 01/24/20 1556    Subjective Pt states significant improvement since last visit,minimal pain since last visit. no pain today in glute. UTs feel tight    Pain Score 3    Pain Location Neck    Pain Orientation Left;Right                             [facility] Adult PT Treatment/Exercise - 01/24/20 1553      Exercises   Exercises Lumbar;Neck      Neck Exercises: Theraband   Rows 20 reps;Green    Horizontal ABduction Green;20 reps    Horizontal ABduction Limitations GTB      Neck Exercises: Standing   Other Standing Exercises Wall push ups x 20      Lumbar Exercises: Stretches   Single Knee to Chest Stretch 3 reps;30 seconds    Piriformis Stretch 3 reps;30 seconds    Piriformis Stretch Limitations seated and supine    Other Lumbar Stretch Exercise Childs pose 30 sec x 2 L, R, center       Lumbar Exercises: Aerobic   Recumbent Bike L2 x 6 min      Lumbar Exercises: Supine   Bridge 20 reps      Lumbar Exercises: Sidelying   Clam 10 reps;Both    Hip Abduction 20 reps;Both      Neck Exercises: Stretches   Upper Trapezius Stretch 2 reps;30 seconds     Levator Stretch 2 reps;30 seconds                  PT Education - 01/24/20 1557    Education Details Educated on initial HEP    Person(s) Educated Patient    Methods Explanation;Demonstration;Tactile cues;Handout;Verbal cues    Comprehension Verbalized understanding;Returned demonstration;Verbal cues required;Need further instruction;Tactile cues required            PT Short Term Goals - 01/24/20 1531      PT SHORT TERM GOAL #1   Title Pt to be independent with initial HEP    Time 2    Period Weeks    Status New    Target Date 01/27/20             PT Long Term Goals - 01/24/20 1532      PT LONG TERM GOAL #1   Title Pt to be independent with final HEP    Time 6    Period Weeks    Status New    Target Date 02/24/20  PT LONG TERM GOAL #2   Title Pt to report decreased pain in R low back and glute to be 0-2/10 with activity    Time 6    Period Weeks    Status New    Target Date 02/24/20      PT LONG TERM GOAL #3   Title Pt to demo soft tissue limitations in UTs and glute to be WNL and pain free.    Time 6    Status New    Target Date 02/24/20      PT LONG TERM GOAL #4   Title Pt to be independent with HEP for neck and shoulder tightness and pain    Time 6    Period Weeks    Status New    Target Date 02/24/20      PT LONG TERM GOAL #5   Title Pt to demo improved strength of bil hips to at least 4+/5 to improve stability and pain    Time 6    Period Weeks    Status New    Target Date 02/24/20                 Plan - 01/24/20 1558    Clinical Impression Statement Pt with decreased pain in glute today. Does have tightness and tenderness in UTs. Reviewed initial HEP and best ther ex today. Plan to do manual for cervical pain/tightness next visit.    Examination-Activity Limitations Locomotion Level;Reach Overhead;Sleep;Squat;Stairs;Stand;Lift    Examination-Participation Restrictions Cleaning;Community Activity;Shop;Yard Work     Stability/Clinical Decision Making Stable/Uncomplicated    Rehab Potential Good    PT Frequency 2x / week    PT Duration 6 weeks    PT Treatment/Interventions ADLs/Self Care Home Management;Cryotherapy;Electrical Stimulation;DME Instruction;Ultrasound;Traction;Moist Heat;Iontophoresis 4mg /ml Dexamethasone;Gait training;Stair training;Functional mobility training;Therapeutic exercise;Balance training;Therapeutic activities;Orthotic Fit/Training;Patient/family education;Neuromuscular re-education;Manual techniques;Passive range of motion;Dry needling;Spinal Manipulations;Joint Manipulations;Taping    Consulted and Agree with Plan of Care Patient           Patient will benefit from skilled therapeutic intervention in order to improve the following deficits and impairments:  Increased muscle spasms, Decreased activity tolerance, Pain, Impaired flexibility, Improper body mechanics, Decreased strength, Decreased mobility  Visit Diagnosis: Acute right-sided low back pain without sciatica  Pain in right hip  Cervicalgia     Problem List Patient Active Problem List   Diagnosis Date Noted  . Anxiety 12/24/2019  . Chronic depression 12/24/2019  . Insomnia 12/24/2019  . Umbilical hernia 35/36/1443  . Lumbar scoliosis 12/24/2019  . Cervicalgia 03/22/2016    Lyndee Hensen, PT, DPT 3:59 PM  01/24/20    [facility] [place], [place], [address] Phone: [phone]   Fax:  [phone]  Name: Katherine [name] MRN: [id number] Date of Birth: October 24, 1970   PHYSICAL THERAPY DISCHARGE SUMMARY  Visits from Start of Care: 2 Plan: Patient agrees to discharge.  Patient goals were partially met. Patient is being discharged due to not returning since the last visit.  ?????     Lyndee Hensen, PT, DPT 1:08 PM  08/03/20

## 2020-01-27 ENCOUNTER — Encounter: Payer: BC Managed Care – PPO | Admitting: Physical Therapy

## 2020-01-29 ENCOUNTER — Encounter: Payer: BC Managed Care – PPO | Admitting: Physical Therapy

## 2020-02-03 ENCOUNTER — Encounter: Payer: BC Managed Care – PPO | Admitting: Physical Therapy

## 2020-02-04 ENCOUNTER — Ambulatory Visit: Payer: BC Managed Care – PPO | Admitting: Family Medicine

## 2020-02-05 ENCOUNTER — Encounter: Payer: BC Managed Care – PPO | Admitting: Physical Therapy

## 2020-02-10 ENCOUNTER — Encounter: Payer: BC Managed Care – PPO | Admitting: Physical Therapy

## 2020-02-12 ENCOUNTER — Encounter: Payer: BC Managed Care – PPO | Admitting: Physical Therapy

## 2021-04-17 IMAGING — CT CT ABD-PELV W/ CM
1 of 3 series · 13 of 32 positions shown, 19 images · IV contrast (APPLIED)
Comparison: None

CLINICAL DATA: Abdominal mass and tenderness on exam.

EXAM:
CT ABDOMEN AND PELVIS WITH CONTRAST
TECHNIQUE: Multidetector CT imaging of the abdomen and pelvis was performed
using the standard protocol following bolus administration of
intravenous contrast.
CONTRAST:  100mL 76MNSY-Q22 IOPAMIDOL (76MNSY-Q22) INJECTION 61%

[Series 2: abd/pelvis w/cm · axial · 0.69mm/px · z∈[-743,-333]mm · 13 of 96 slices shown, 19 images]
[im 7/96  soft-tissue]
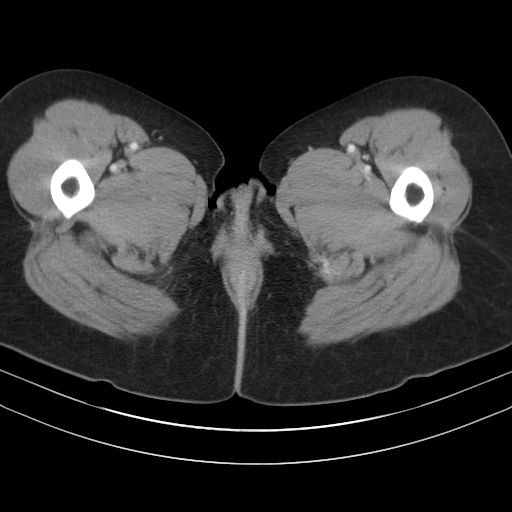
[im 7/96  bone]
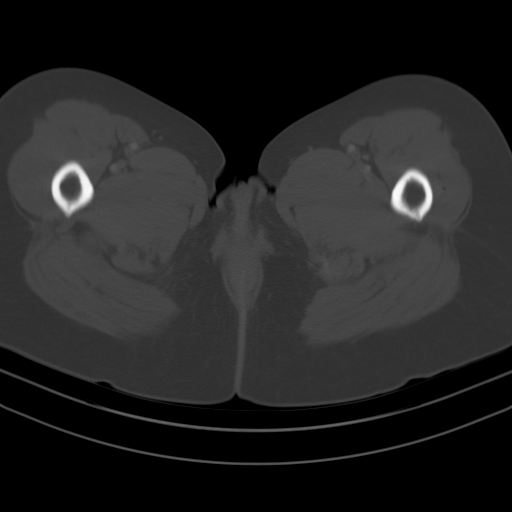
[im 13/96  soft-tissue]
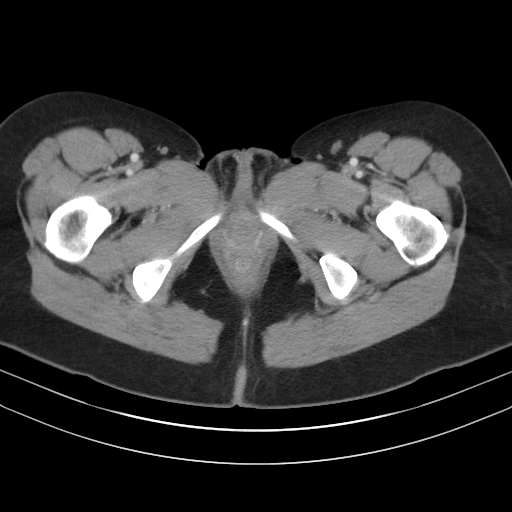
[im 20/96  soft-tissue]
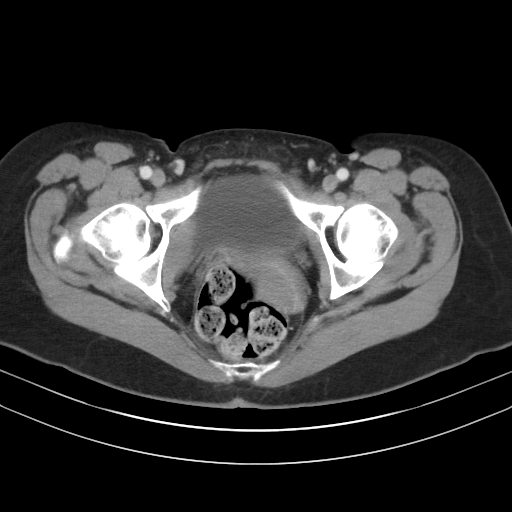
[im 26/96  soft-tissue]
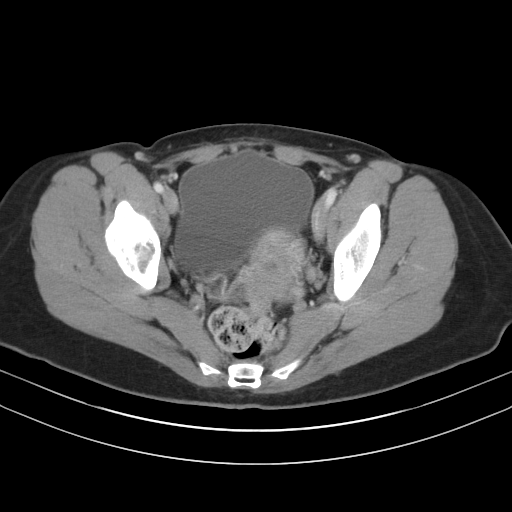
[im 32/96  soft-tissue]
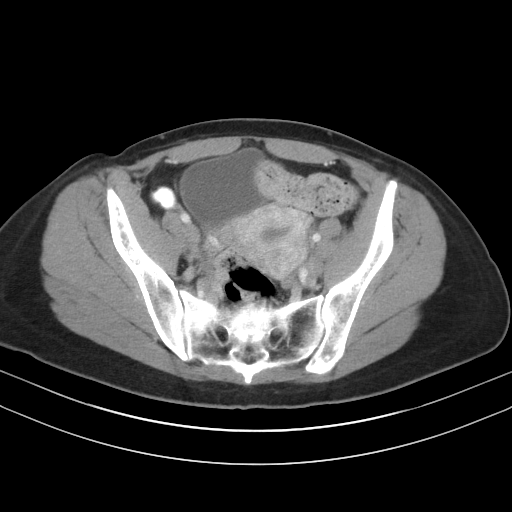
[im 39/96  soft-tissue]
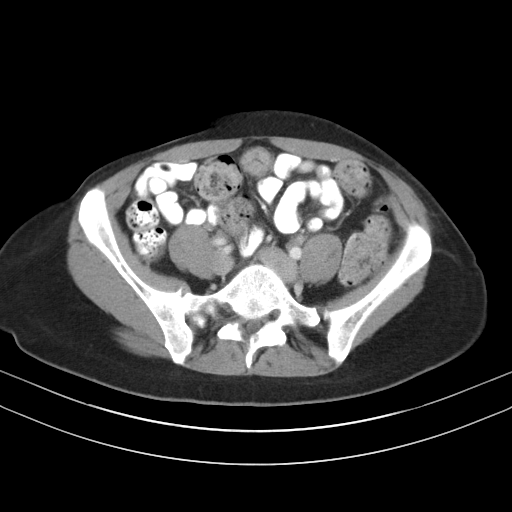
[im 51/96  soft-tissue]
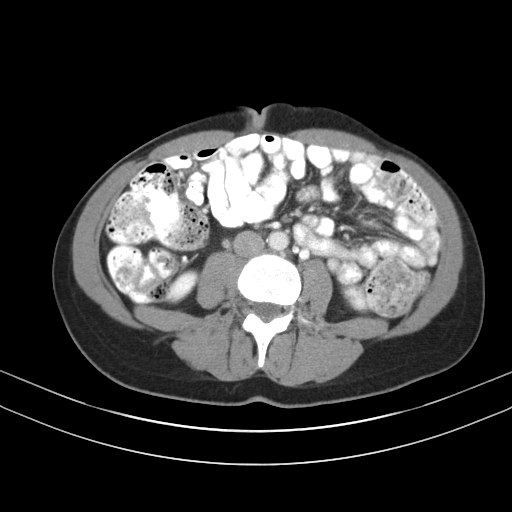
[im 58/96  soft-tissue]
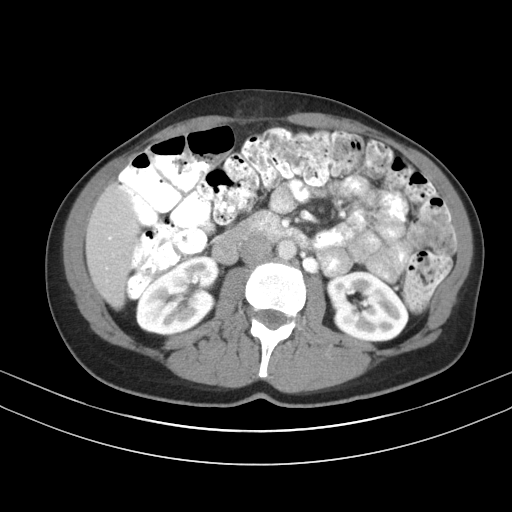
[im 64/96  soft-tissue]
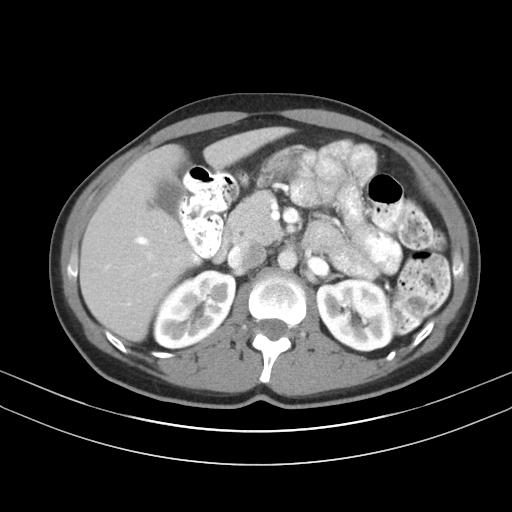
[im 64/96  bone]
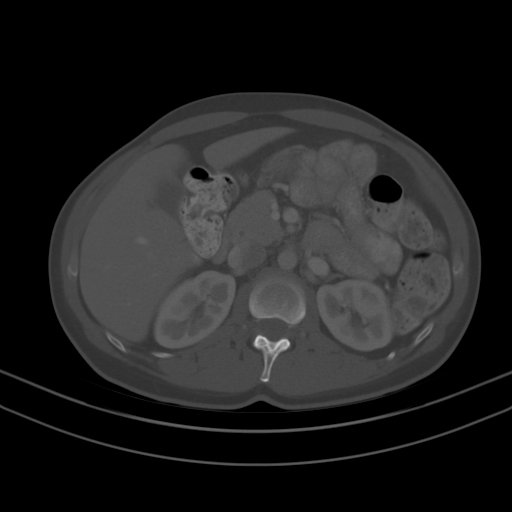
[im 70/96  soft-tissue]
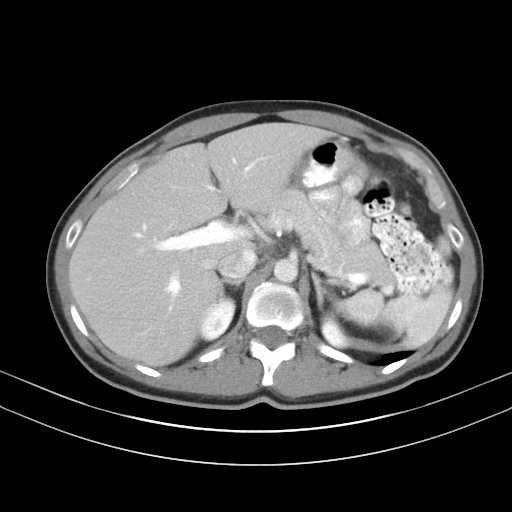
[im 70/96  lung]
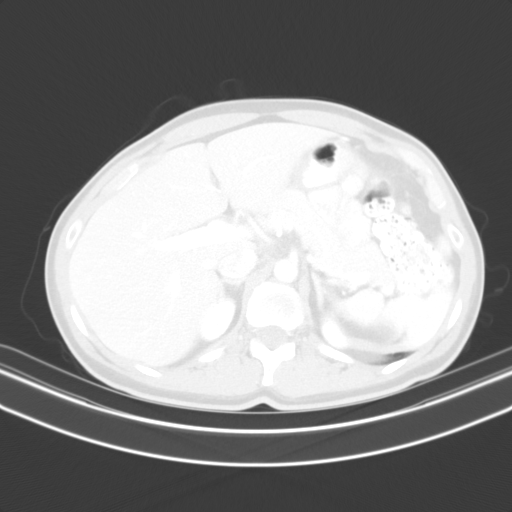
[im 77/96  soft-tissue]
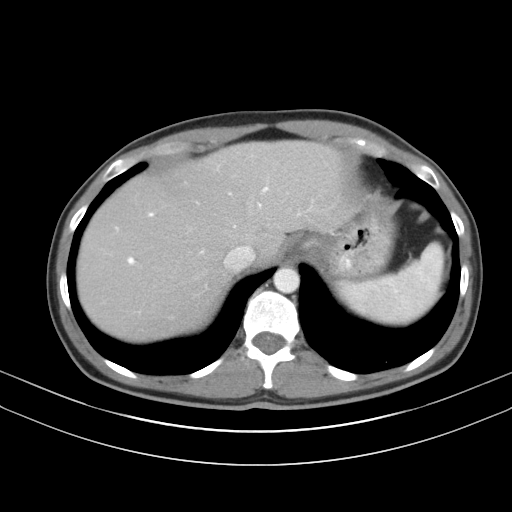
[im 77/96  lung]
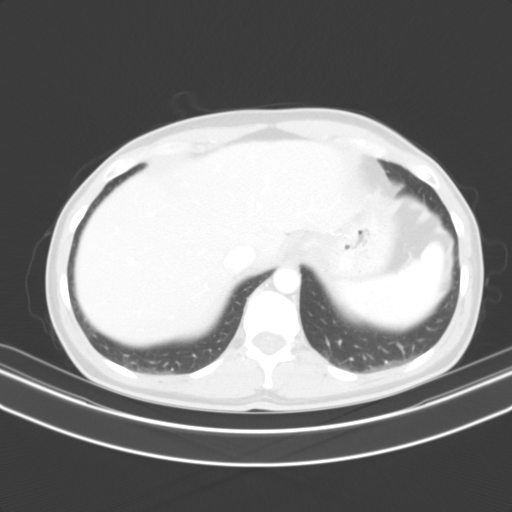
[im 83/96  soft-tissue]
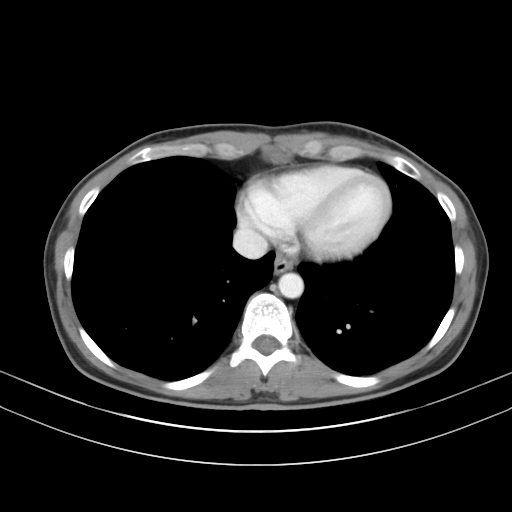
[im 83/96  lung]
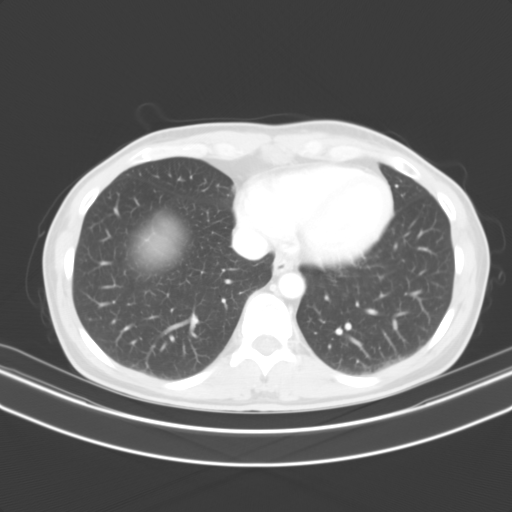
[im 89/96  soft-tissue]
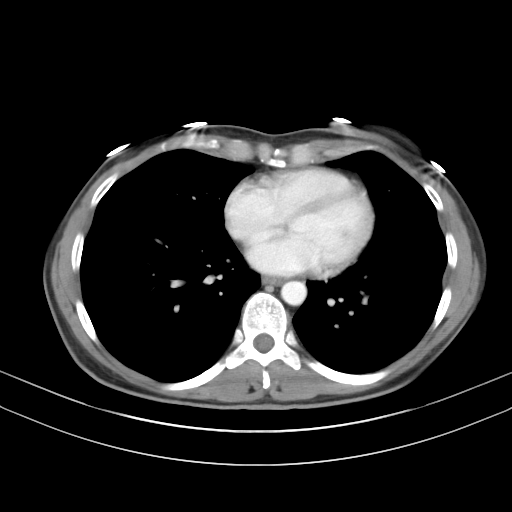
[im 89/96  lung]
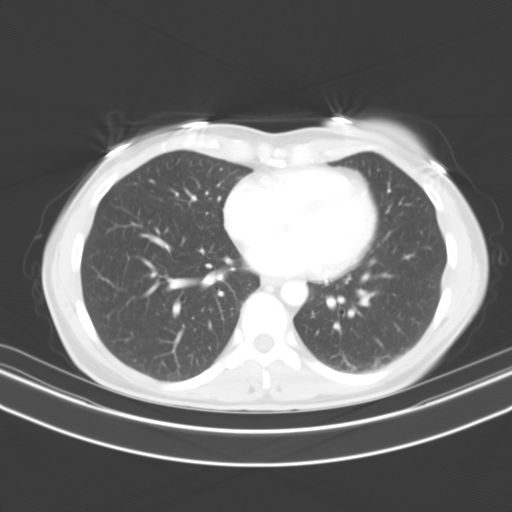

[13 of 32 positions shown; findings below may reference images not displayed]

FINDINGS: Lower Chest: No acute findings.

Hepatobiliary: No hepatic masses identified. Gallbladder is
unremarkable. No evidence of biliary ductal dilatation.

Pancreas:  No mass or inflammatory changes.

Spleen: Within normal limits in size and appearance.

Adrenals/Urinary Tract: No masses identified. No evidence of
ureteral calculi or hydronephrosis.

Stomach/Bowel: No evidence of obstruction, inflammatory process or
abnormal fluid collections. Large stool burden noted throughout the
colon.

Vascular/Lymphatic: No pathologically enlarged lymph nodes. No
abdominal aortic aneurysm. Mild Aortic atherosclerosis noted.

Reproductive:  No mass or other significant abnormality.

Other: A small paraumbilical ventral hernia is seen containing only
fat. No evidence of soft tissue mass or inflammatory process.

Musculoskeletal:  No suspicious bone lesions identified.
IMPRESSION: No acute findings within the abdomen or pelvis. No evidence of
neoplasm.

Large stool burden noted; recommend clinical correlation for
possible constipation.

Small paraumbilical ventral hernia containing only fat.

Aortic Atherosclerosis ([id number]).

## 2022-02-07 ENCOUNTER — Other Ambulatory Visit: Payer: Self-pay | Admitting: Family Medicine

## 2022-02-07 DIAGNOSIS — E7800 Pure hypercholesterolemia, unspecified: Secondary | ICD-10-CM

## 2022-03-27 ENCOUNTER — Encounter: Payer: Self-pay | Admitting: Family Medicine

## 2022-03-28 ENCOUNTER — Other Ambulatory Visit: Payer: BC Managed Care – PPO

## 2022-04-28 ENCOUNTER — Ambulatory Visit
Admission: RE | Admit: 2022-04-28 | Discharge: 2022-04-28 | Disposition: A | Discharge status: Home / Self Care | Payer: BC Managed Care – PPO | Source: Ambulatory Visit | Attending: Family Medicine | Admitting: Family Medicine

## 2022-04-28 DIAGNOSIS — E7800 Pure hypercholesterolemia, unspecified: Secondary | ICD-10-CM

## 2023-10-15 ENCOUNTER — Other Ambulatory Visit: Payer: Self-pay | Admitting: Obstetrics and Gynecology

## 2023-10-15 DIAGNOSIS — Z1231 Encounter for screening mammogram for malignant neoplasm of breast: Secondary | ICD-10-CM

## 2024-01-08 ENCOUNTER — Ambulatory Visit
Admission: RE | Admit: 2024-01-08 | Discharge: 2024-01-08 | Disposition: A | Discharge status: Home / Self Care | Payer: Self-pay | Source: Ambulatory Visit | Attending: Obstetrics and Gynecology | Admitting: Obstetrics and Gynecology

## 2024-01-08 DIAGNOSIS — Z1231 Encounter for screening mammogram for malignant neoplasm of breast: Secondary | ICD-10-CM
